# Patient Record
Sex: Female | Born: 1965 | Race: White | Hispanic: No | Marital: Married | Smoking: Never smoker
Health system: Southern US, Community
[De-identification: ages and names within clinical notes are randomized; demographics above are authoritative.]

## PROBLEM LIST (undated history)

## (undated) DIAGNOSIS — R002 Palpitations: Secondary | ICD-10-CM

## (undated) DIAGNOSIS — E039 Hypothyroidism, unspecified: Secondary | ICD-10-CM

## (undated) DIAGNOSIS — F909 Attention-deficit hyperactivity disorder, unspecified type: Secondary | ICD-10-CM

## (undated) HISTORY — DX: Palpitations: R00.2

---

## 1985-07-11 HISTORY — PX: APPENDECTOMY: SHX54

## 2004-09-08 ENCOUNTER — Encounter: Admission: RE | Admit: 2004-09-08 | Discharge: 2004-09-08 | Payer: Self-pay | Admitting: Family Medicine

## 2004-09-08 ENCOUNTER — Other Ambulatory Visit: Admission: RE | Admit: 2004-09-08 | Discharge: 2004-09-08 | Payer: Self-pay | Admitting: Family Medicine

## 2004-09-10 ENCOUNTER — Ambulatory Visit (HOSPITAL_COMMUNITY): Admission: RE | Admit: 2004-09-10 | Discharge: 2004-09-10 | Payer: Self-pay | Admitting: Family Medicine

## 2005-10-05 ENCOUNTER — Emergency Department (HOSPITAL_COMMUNITY): Admission: EM | Admit: 2005-10-05 | Discharge: 2005-10-05 | Payer: Self-pay | Admitting: Family Medicine

## 2005-12-08 ENCOUNTER — Other Ambulatory Visit: Admission: RE | Admit: 2005-12-08 | Discharge: 2005-12-08 | Payer: Self-pay | Admitting: Family Medicine

## 2006-07-16 ENCOUNTER — Emergency Department (HOSPITAL_COMMUNITY): Admission: EM | Admit: 2006-07-16 | Discharge: 2006-07-16 | Payer: Self-pay | Admitting: Family Medicine

## 2006-10-13 ENCOUNTER — Emergency Department (HOSPITAL_COMMUNITY): Admission: EM | Admit: 2006-10-13 | Discharge: 2006-10-13 | Payer: Self-pay | Admitting: Emergency Medicine

## 2007-11-05 ENCOUNTER — Other Ambulatory Visit: Admission: RE | Admit: 2007-11-05 | Discharge: 2007-11-05 | Payer: Self-pay | Admitting: Family Medicine

## 2009-12-02 ENCOUNTER — Emergency Department (HOSPITAL_COMMUNITY): Admission: EM | Admit: 2009-12-02 | Discharge: 2009-12-02 | Payer: Self-pay | Admitting: Family Medicine

## 2010-08-01 ENCOUNTER — Encounter: Payer: Self-pay | Admitting: Family Medicine

## 2010-08-02 ENCOUNTER — Encounter: Payer: Self-pay | Admitting: Family Medicine

## 2010-09-02 ENCOUNTER — Other Ambulatory Visit: Payer: Self-pay | Admitting: Family Medicine

## 2010-09-02 DIAGNOSIS — Z1231 Encounter for screening mammogram for malignant neoplasm of breast: Secondary | ICD-10-CM

## 2010-09-14 ENCOUNTER — Ambulatory Visit
Admission: RE | Admit: 2010-09-14 | Discharge: 2010-09-14 | Disposition: A | Discharge status: Home / Self Care | Payer: BC Managed Care – PPO | Source: Ambulatory Visit | Attending: Family Medicine | Admitting: Family Medicine

## 2010-09-14 DIAGNOSIS — Z1231 Encounter for screening mammogram for malignant neoplasm of breast: Secondary | ICD-10-CM

## 2013-08-25 ENCOUNTER — Encounter: Payer: Self-pay | Admitting: *Deleted

## 2013-08-25 DIAGNOSIS — R002 Palpitations: Secondary | ICD-10-CM | POA: Insufficient documentation

## 2017-05-17 ENCOUNTER — Other Ambulatory Visit: Payer: Self-pay | Admitting: Nurse Practitioner

## 2017-05-17 DIAGNOSIS — Z1239 Encounter for other screening for malignant neoplasm of breast: Secondary | ICD-10-CM

## 2017-05-24 ENCOUNTER — Ambulatory Visit (INDEPENDENT_AMBULATORY_CARE_PROVIDER_SITE_OTHER): Payer: BLUE CROSS/BLUE SHIELD

## 2017-05-24 DIAGNOSIS — Z1231 Encounter for screening mammogram for malignant neoplasm of breast: Secondary | ICD-10-CM

## 2017-05-24 DIAGNOSIS — Z1239 Encounter for other screening for malignant neoplasm of breast: Secondary | ICD-10-CM

## 2018-08-26 IMAGING — MG 2D DIGITAL SCREENING BILATERAL MAMMOGRAM WITH CAD AND ADJUNCT TO
9 series · 9 of 25 positions shown · non-contrast
Comparison: Previous exam(s).

CLINICAL DATA: Screening.

EXAM:
2D DIGITAL SCREENING BILATERAL MAMMOGRAM WITH CAD AND ADJUNCT TOMO

[R MLO (1 of 2)]
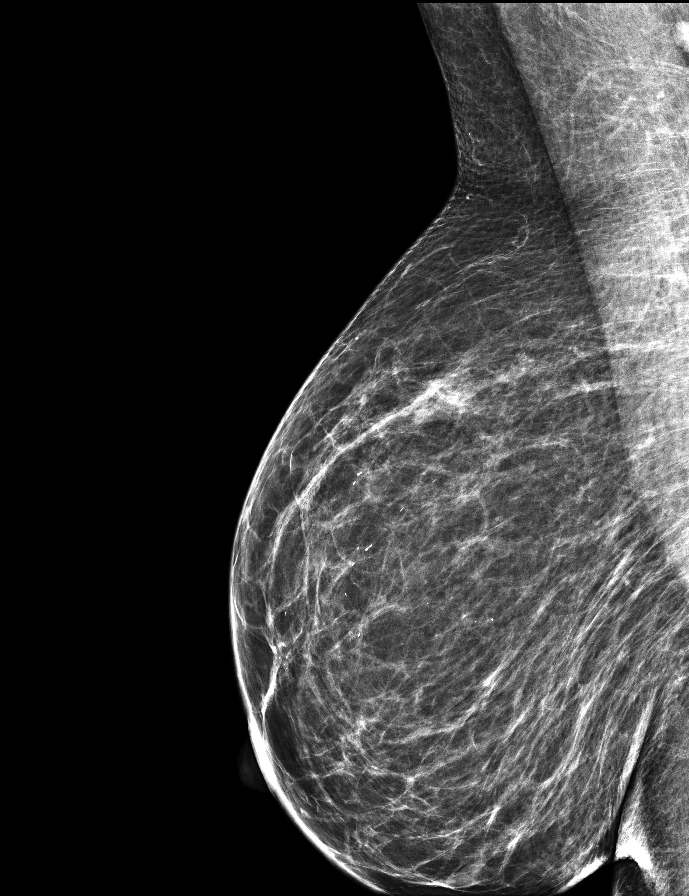

[R MLO (2 of 2)]
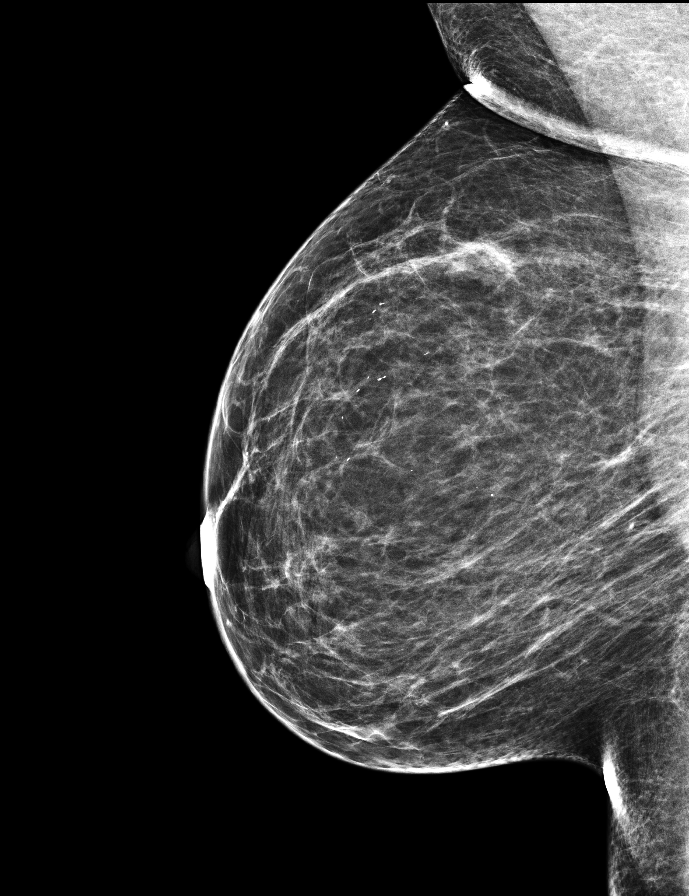

[L CC]
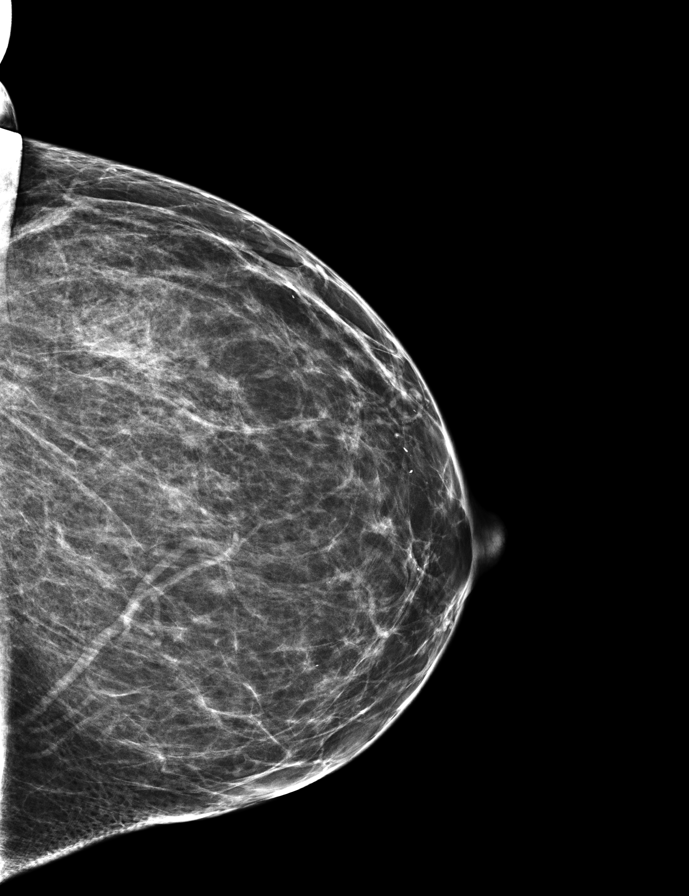

[R CC]
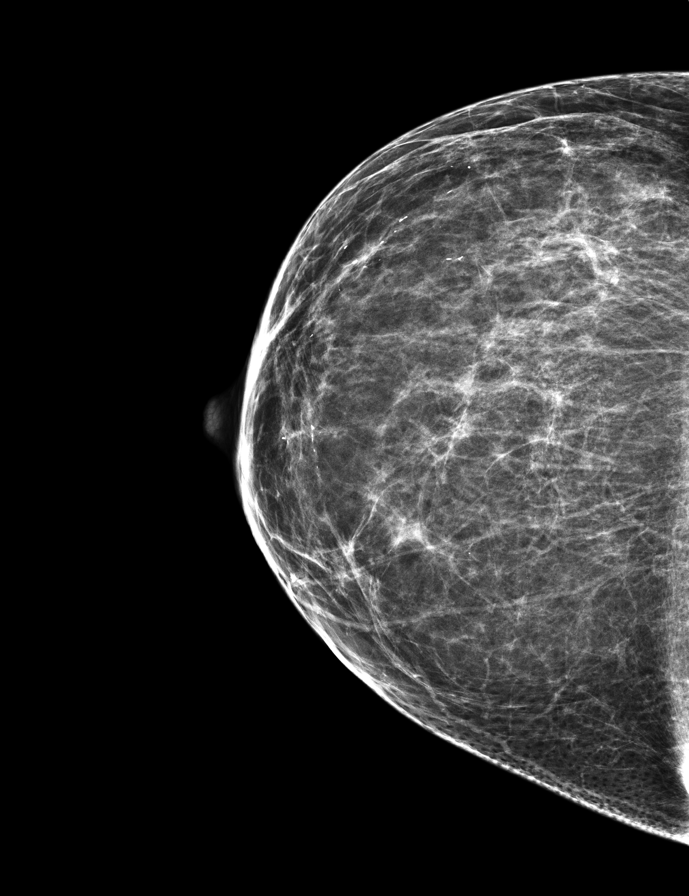

[L MLO]
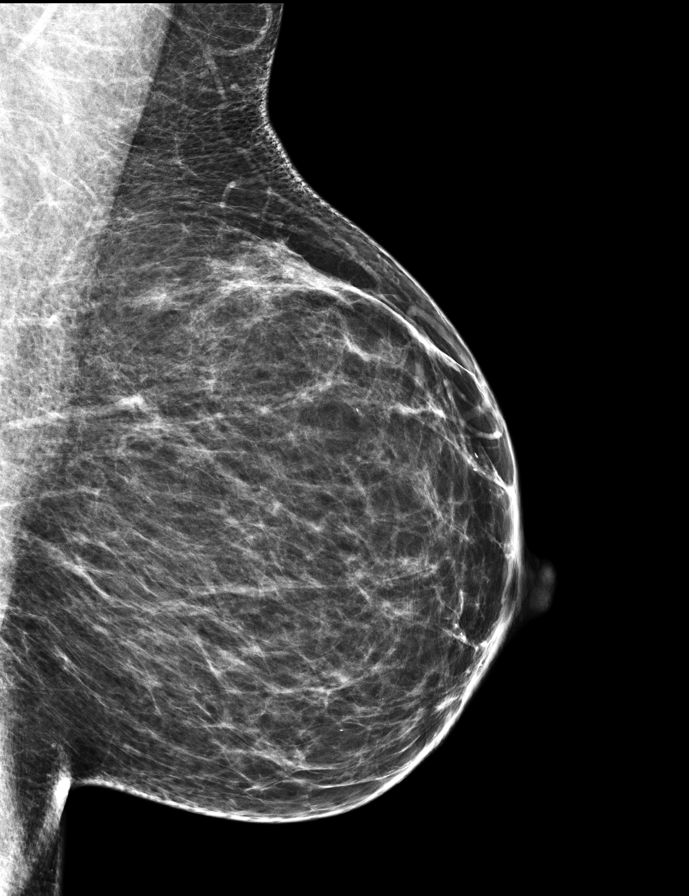

[R CC tomo · tomo slice 33/64.0]
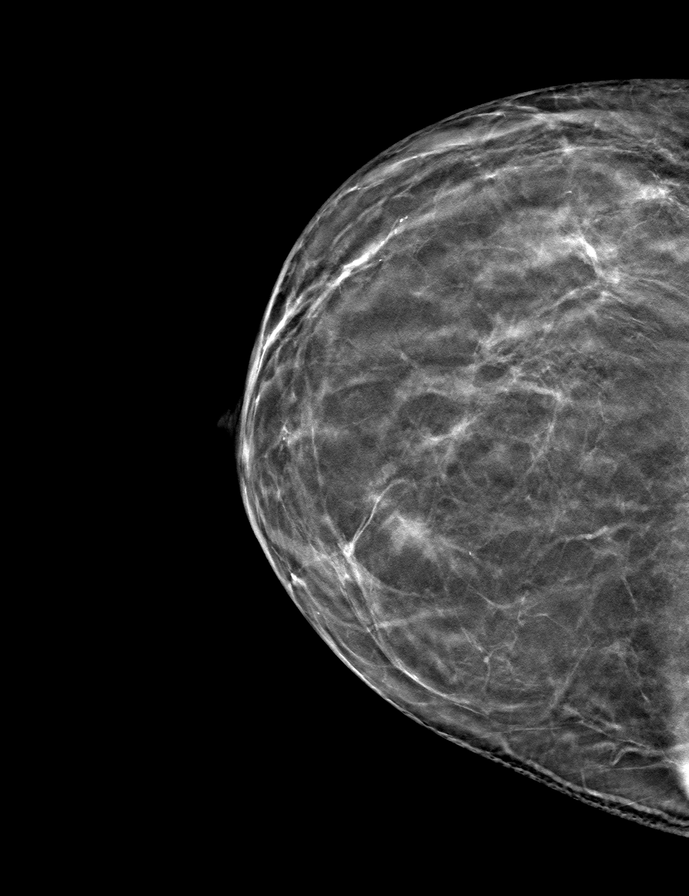

[L CC tomo · tomo slice 34/67.0]
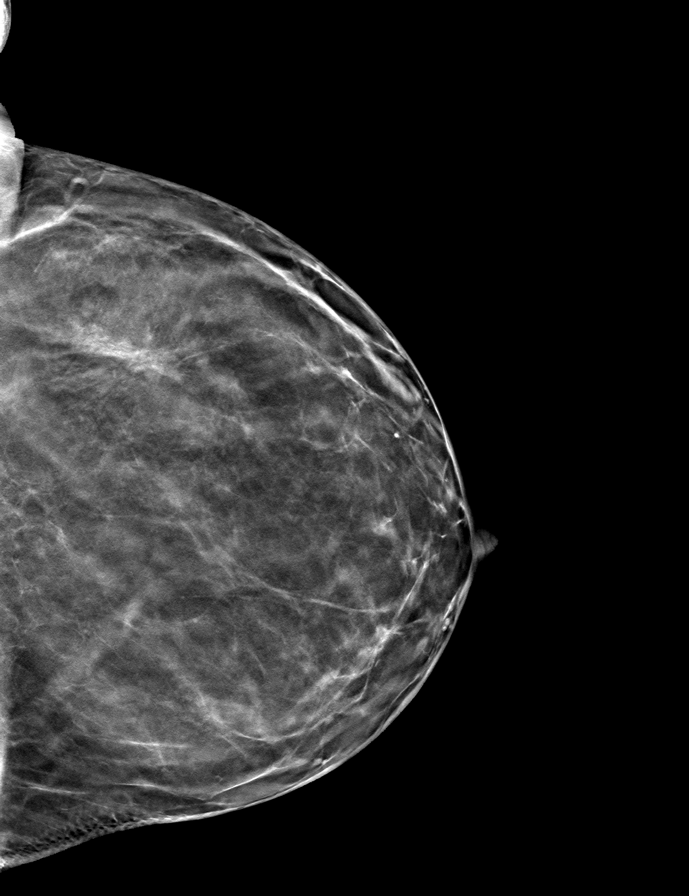

[R MLO tomo · tomo slice 35/70.0]
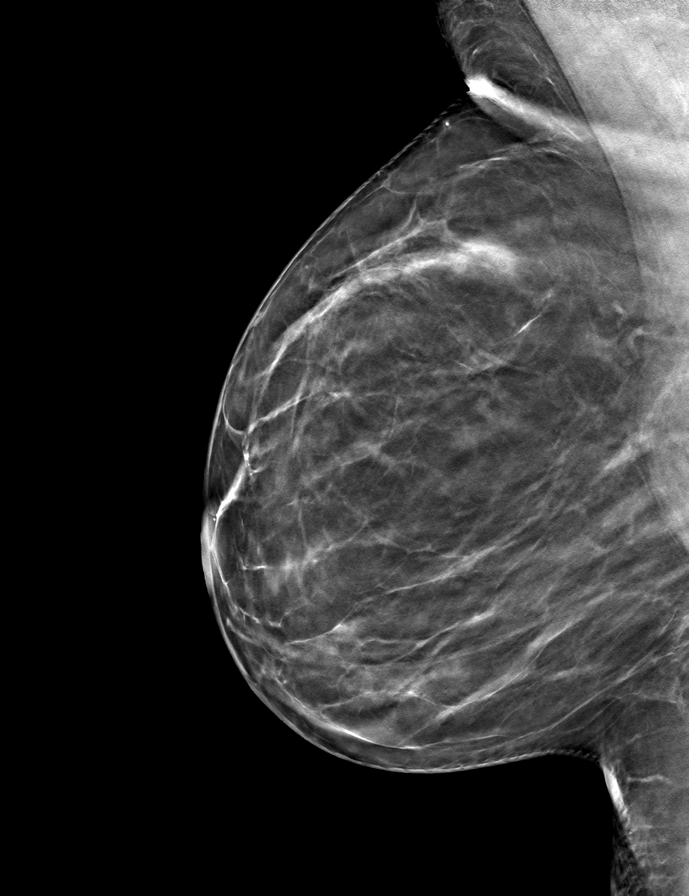

[L MLO tomo · tomo slice 35/68.0]
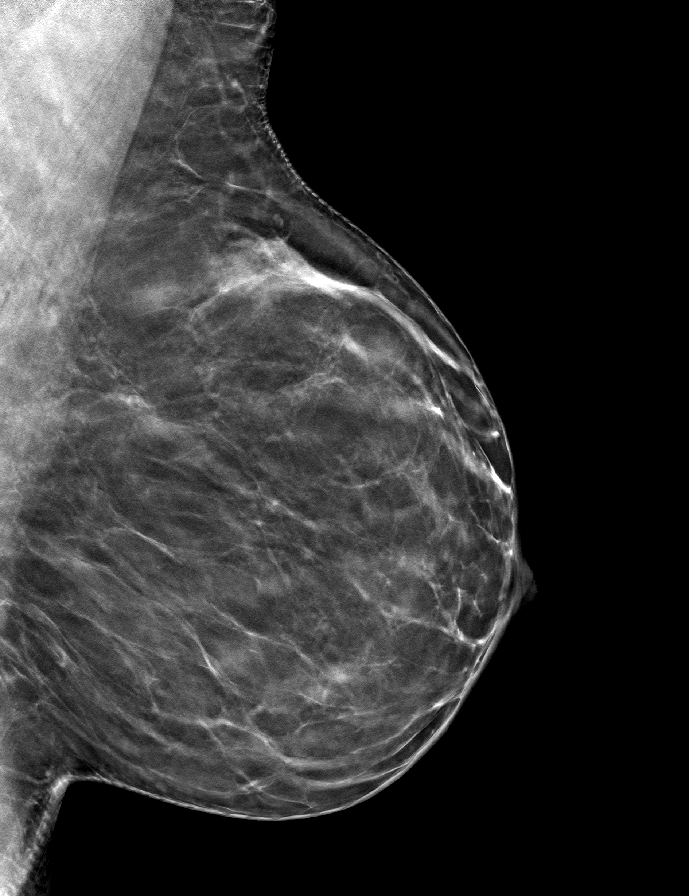

[9 of 25 positions shown; findings below may reference images not displayed]

ACR Breast Density Category b: There are scattered areas of
fibroglandular density.
FINDINGS: There are no findings suspicious for malignancy. Images were
processed with CAD.
IMPRESSION: No mammographic evidence of malignancy. A result letter of this
screening mammogram will be mailed directly to the patient.

RECOMMENDATION:
Screening mammogram in one year. (Code:97-6-RS4)

BI-RADS CATEGORY  1: Negative.

## 2020-04-14 DIAGNOSIS — E538 Deficiency of other specified B group vitamins: Secondary | ICD-10-CM | POA: Diagnosis not present

## 2020-04-14 DIAGNOSIS — N926 Irregular menstruation, unspecified: Secondary | ICD-10-CM | POA: Diagnosis not present

## 2020-04-14 DIAGNOSIS — E559 Vitamin D deficiency, unspecified: Secondary | ICD-10-CM | POA: Diagnosis not present

## 2020-04-14 DIAGNOSIS — R635 Abnormal weight gain: Secondary | ICD-10-CM | POA: Diagnosis not present

## 2020-04-14 DIAGNOSIS — L309 Dermatitis, unspecified: Secondary | ICD-10-CM | POA: Diagnosis not present

## 2020-04-28 DIAGNOSIS — E039 Hypothyroidism, unspecified: Secondary | ICD-10-CM | POA: Diagnosis not present

## 2020-04-28 DIAGNOSIS — L309 Dermatitis, unspecified: Secondary | ICD-10-CM | POA: Diagnosis not present

## 2020-04-28 DIAGNOSIS — E559 Vitamin D deficiency, unspecified: Secondary | ICD-10-CM | POA: Diagnosis not present

## 2020-04-28 DIAGNOSIS — N951 Menopausal and female climacteric states: Secondary | ICD-10-CM | POA: Diagnosis not present

## 2020-07-21 DIAGNOSIS — L309 Dermatitis, unspecified: Secondary | ICD-10-CM | POA: Diagnosis not present

## 2020-07-21 DIAGNOSIS — R635 Abnormal weight gain: Secondary | ICD-10-CM | POA: Diagnosis not present

## 2020-07-21 DIAGNOSIS — E559 Vitamin D deficiency, unspecified: Secondary | ICD-10-CM | POA: Diagnosis not present

## 2020-07-21 DIAGNOSIS — E039 Hypothyroidism, unspecified: Secondary | ICD-10-CM | POA: Diagnosis not present

## 2020-07-21 DIAGNOSIS — E538 Deficiency of other specified B group vitamins: Secondary | ICD-10-CM | POA: Diagnosis not present

## 2020-08-04 DIAGNOSIS — E039 Hypothyroidism, unspecified: Secondary | ICD-10-CM | POA: Diagnosis not present

## 2020-08-04 DIAGNOSIS — E538 Deficiency of other specified B group vitamins: Secondary | ICD-10-CM | POA: Diagnosis not present

## 2020-08-04 DIAGNOSIS — L309 Dermatitis, unspecified: Secondary | ICD-10-CM | POA: Diagnosis not present

## 2020-08-04 DIAGNOSIS — N951 Menopausal and female climacteric states: Secondary | ICD-10-CM | POA: Diagnosis not present

## 2020-10-09 DIAGNOSIS — Z2882 Immunization not carried out because of caregiver refusal: Secondary | ICD-10-CM | POA: Diagnosis not present

## 2021-02-11 DIAGNOSIS — E538 Deficiency of other specified B group vitamins: Secondary | ICD-10-CM | POA: Diagnosis not present

## 2021-02-11 DIAGNOSIS — Z131 Encounter for screening for diabetes mellitus: Secondary | ICD-10-CM | POA: Diagnosis not present

## 2021-02-11 DIAGNOSIS — E039 Hypothyroidism, unspecified: Secondary | ICD-10-CM | POA: Diagnosis not present

## 2021-02-11 DIAGNOSIS — E559 Vitamin D deficiency, unspecified: Secondary | ICD-10-CM | POA: Diagnosis not present

## 2021-02-11 DIAGNOSIS — N951 Menopausal and female climacteric states: Secondary | ICD-10-CM | POA: Diagnosis not present

## 2021-07-27 DIAGNOSIS — S99922A Unspecified injury of left foot, initial encounter: Secondary | ICD-10-CM | POA: Diagnosis not present

## 2021-07-27 DIAGNOSIS — S92512A Displaced fracture of proximal phalanx of left lesser toe(s), initial encounter for closed fracture: Secondary | ICD-10-CM | POA: Diagnosis not present

## 2021-08-03 DIAGNOSIS — Z131 Encounter for screening for diabetes mellitus: Secondary | ICD-10-CM | POA: Diagnosis not present

## 2021-08-03 DIAGNOSIS — E559 Vitamin D deficiency, unspecified: Secondary | ICD-10-CM | POA: Diagnosis not present

## 2021-08-03 DIAGNOSIS — E60 Dietary zinc deficiency: Secondary | ICD-10-CM | POA: Diagnosis not present

## 2021-08-03 DIAGNOSIS — E039 Hypothyroidism, unspecified: Secondary | ICD-10-CM | POA: Diagnosis not present

## 2021-08-03 DIAGNOSIS — N951 Menopausal and female climacteric states: Secondary | ICD-10-CM | POA: Diagnosis not present

## 2021-08-11 DIAGNOSIS — M79675 Pain in left toe(s): Secondary | ICD-10-CM | POA: Diagnosis not present

## 2021-08-18 DIAGNOSIS — E538 Deficiency of other specified B group vitamins: Secondary | ICD-10-CM | POA: Diagnosis not present

## 2021-08-18 DIAGNOSIS — E039 Hypothyroidism, unspecified: Secondary | ICD-10-CM | POA: Diagnosis not present

## 2021-08-18 DIAGNOSIS — E559 Vitamin D deficiency, unspecified: Secondary | ICD-10-CM | POA: Diagnosis not present

## 2021-08-18 DIAGNOSIS — N951 Menopausal and female climacteric states: Secondary | ICD-10-CM | POA: Diagnosis not present

## 2021-08-25 DIAGNOSIS — M79675 Pain in left toe(s): Secondary | ICD-10-CM | POA: Diagnosis not present

## 2021-10-08 DIAGNOSIS — M79675 Pain in left toe(s): Secondary | ICD-10-CM | POA: Diagnosis not present

## 2022-03-09 DIAGNOSIS — Z01419 Encounter for gynecological examination (general) (routine) without abnormal findings: Secondary | ICD-10-CM | POA: Diagnosis not present

## 2022-03-09 DIAGNOSIS — Z124 Encounter for screening for malignant neoplasm of cervix: Secondary | ICD-10-CM | POA: Diagnosis not present

## 2022-03-09 DIAGNOSIS — Z23 Encounter for immunization: Secondary | ICD-10-CM | POA: Diagnosis not present

## 2022-03-11 ENCOUNTER — Other Ambulatory Visit: Payer: Self-pay | Admitting: Family Medicine

## 2022-03-11 DIAGNOSIS — K429 Umbilical hernia without obstruction or gangrene: Secondary | ICD-10-CM

## 2022-03-15 ENCOUNTER — Other Ambulatory Visit: Payer: Self-pay | Admitting: Family Medicine

## 2022-03-15 DIAGNOSIS — Z1231 Encounter for screening mammogram for malignant neoplasm of breast: Secondary | ICD-10-CM

## 2022-03-21 ENCOUNTER — Ambulatory Visit (INDEPENDENT_AMBULATORY_CARE_PROVIDER_SITE_OTHER): Payer: BC Managed Care – PPO

## 2022-03-21 DIAGNOSIS — K429 Umbilical hernia without obstruction or gangrene: Secondary | ICD-10-CM

## 2022-03-21 DIAGNOSIS — K760 Fatty (change of) liver, not elsewhere classified: Secondary | ICD-10-CM | POA: Diagnosis not present

## 2022-03-24 ENCOUNTER — Ambulatory Visit (INDEPENDENT_AMBULATORY_CARE_PROVIDER_SITE_OTHER): Payer: BC Managed Care – PPO

## 2022-03-24 DIAGNOSIS — Z1231 Encounter for screening mammogram for malignant neoplasm of breast: Secondary | ICD-10-CM

## 2022-03-25 DIAGNOSIS — Z13220 Encounter for screening for lipoid disorders: Secondary | ICD-10-CM | POA: Diagnosis not present

## 2022-03-25 DIAGNOSIS — R221 Localized swelling, mass and lump, neck: Secondary | ICD-10-CM | POA: Diagnosis not present

## 2022-03-25 DIAGNOSIS — E6609 Other obesity due to excess calories: Secondary | ICD-10-CM | POA: Diagnosis not present

## 2022-03-30 DIAGNOSIS — N951 Menopausal and female climacteric states: Secondary | ICD-10-CM | POA: Diagnosis not present

## 2022-03-30 DIAGNOSIS — E559 Vitamin D deficiency, unspecified: Secondary | ICD-10-CM | POA: Diagnosis not present

## 2022-03-30 DIAGNOSIS — E538 Deficiency of other specified B group vitamins: Secondary | ICD-10-CM | POA: Diagnosis not present

## 2022-03-30 DIAGNOSIS — R739 Hyperglycemia, unspecified: Secondary | ICD-10-CM | POA: Diagnosis not present

## 2022-03-30 DIAGNOSIS — E039 Hypothyroidism, unspecified: Secondary | ICD-10-CM | POA: Diagnosis not present

## 2022-03-31 ENCOUNTER — Telehealth (HOSPITAL_BASED_OUTPATIENT_CLINIC_OR_DEPARTMENT_OTHER): Payer: Self-pay

## 2022-03-31 ENCOUNTER — Other Ambulatory Visit (HOSPITAL_BASED_OUTPATIENT_CLINIC_OR_DEPARTMENT_OTHER): Payer: Self-pay | Admitting: Family Medicine

## 2022-03-31 DIAGNOSIS — R221 Localized swelling, mass and lump, neck: Secondary | ICD-10-CM

## 2022-04-12 DIAGNOSIS — N841 Polyp of cervix uteri: Secondary | ICD-10-CM | POA: Diagnosis not present

## 2022-04-12 DIAGNOSIS — R87619 Unspecified abnormal cytological findings in specimens from cervix uteri: Secondary | ICD-10-CM | POA: Diagnosis not present

## 2022-04-13 DIAGNOSIS — E039 Hypothyroidism, unspecified: Secondary | ICD-10-CM | POA: Diagnosis not present

## 2022-04-13 DIAGNOSIS — N951 Menopausal and female climacteric states: Secondary | ICD-10-CM | POA: Diagnosis not present

## 2022-04-19 ENCOUNTER — Ambulatory Visit (HOSPITAL_BASED_OUTPATIENT_CLINIC_OR_DEPARTMENT_OTHER)
Admission: RE | Admit: 2022-04-19 | Discharge: 2022-04-19 | Disposition: A | Discharge status: Home / Self Care | Payer: BC Managed Care – PPO | Source: Ambulatory Visit | Attending: Family Medicine | Admitting: Family Medicine

## 2022-04-19 DIAGNOSIS — R221 Localized swelling, mass and lump, neck: Secondary | ICD-10-CM | POA: Insufficient documentation

## 2022-04-19 DIAGNOSIS — E042 Nontoxic multinodular goiter: Secondary | ICD-10-CM | POA: Diagnosis not present

## 2022-05-11 DIAGNOSIS — R41840 Attention and concentration deficit: Secondary | ICD-10-CM | POA: Diagnosis not present

## 2022-05-12 DIAGNOSIS — R41840 Attention and concentration deficit: Secondary | ICD-10-CM | POA: Diagnosis not present

## 2022-05-17 DIAGNOSIS — F419 Anxiety disorder, unspecified: Secondary | ICD-10-CM | POA: Diagnosis not present

## 2022-05-17 DIAGNOSIS — R41840 Attention and concentration deficit: Secondary | ICD-10-CM | POA: Diagnosis not present

## 2022-05-24 DIAGNOSIS — N841 Polyp of cervix uteri: Secondary | ICD-10-CM | POA: Diagnosis not present

## 2022-05-25 DIAGNOSIS — Z23 Encounter for immunization: Secondary | ICD-10-CM | POA: Diagnosis not present

## 2022-06-17 DIAGNOSIS — R41840 Attention and concentration deficit: Secondary | ICD-10-CM | POA: Diagnosis not present

## 2022-06-17 DIAGNOSIS — L720 Epidermal cyst: Secondary | ICD-10-CM | POA: Diagnosis not present

## 2022-06-17 DIAGNOSIS — L821 Other seborrheic keratosis: Secondary | ICD-10-CM | POA: Diagnosis not present

## 2022-06-17 DIAGNOSIS — D2261 Melanocytic nevi of right upper limb, including shoulder: Secondary | ICD-10-CM | POA: Diagnosis not present

## 2022-06-17 DIAGNOSIS — L570 Actinic keratosis: Secondary | ICD-10-CM | POA: Diagnosis not present

## 2022-06-17 DIAGNOSIS — L820 Inflamed seborrheic keratosis: Secondary | ICD-10-CM | POA: Diagnosis not present

## 2022-06-17 DIAGNOSIS — D2262 Melanocytic nevi of left upper limb, including shoulder: Secondary | ICD-10-CM | POA: Diagnosis not present

## 2022-06-20 DIAGNOSIS — D122 Benign neoplasm of ascending colon: Secondary | ICD-10-CM | POA: Diagnosis not present

## 2022-06-20 DIAGNOSIS — D120 Benign neoplasm of cecum: Secondary | ICD-10-CM | POA: Diagnosis not present

## 2022-06-20 DIAGNOSIS — K635 Polyp of colon: Secondary | ICD-10-CM | POA: Diagnosis not present

## 2022-06-20 DIAGNOSIS — R41840 Attention and concentration deficit: Secondary | ICD-10-CM | POA: Diagnosis not present

## 2022-06-20 DIAGNOSIS — Z1211 Encounter for screening for malignant neoplasm of colon: Secondary | ICD-10-CM | POA: Diagnosis not present

## 2022-06-22 DIAGNOSIS — Z79899 Other long term (current) drug therapy: Secondary | ICD-10-CM | POA: Diagnosis not present

## 2022-06-22 DIAGNOSIS — F419 Anxiety disorder, unspecified: Secondary | ICD-10-CM | POA: Diagnosis not present

## 2022-06-22 DIAGNOSIS — F902 Attention-deficit hyperactivity disorder, combined type: Secondary | ICD-10-CM | POA: Diagnosis not present

## 2022-07-13 ENCOUNTER — Other Ambulatory Visit (HOSPITAL_BASED_OUTPATIENT_CLINIC_OR_DEPARTMENT_OTHER): Payer: Self-pay

## 2022-07-13 MED ORDER — LISDEXAMFETAMINE DIMESYLATE 40 MG PO CAPS
40.0000 mg | ORAL_CAPSULE | Freq: Every day | ORAL | 0 refills | Status: DC
  Filled 2022-07-13: qty 30, 30d supply, fill #0

## 2022-08-05 ENCOUNTER — Other Ambulatory Visit (HOSPITAL_COMMUNITY): Payer: Self-pay

## 2022-08-11 ENCOUNTER — Other Ambulatory Visit (HOSPITAL_BASED_OUTPATIENT_CLINIC_OR_DEPARTMENT_OTHER): Payer: Self-pay

## 2022-08-11 MED ORDER — LISDEXAMFETAMINE DIMESYLATE 40 MG PO CAPS
40.0000 mg | ORAL_CAPSULE | Freq: Every day | ORAL | 0 refills | Status: DC
  Filled 2022-08-11: qty 30, 30d supply, fill #0

## 2022-08-14 NOTE — H&P (Incomplete)
Kylie Lynch is an 57 y.o. postmenopausal G5P5 who is admitted for Hysteroscopy with Dilation and Curettage with possible Myosure polypectomy and removal of cervical polyps.  Two mildly friable cervical polyps were noted on previous exam 04/09/2022. Pt prefers for outpatient removal of cervical polyps.  Patient had a colposcopy and EMB (benign) for AUGS/HRHPV negative pap smear.   Patient Active Problem List   Diagnosis Date Noted   Cervical polyp 08/27/2022   Pain of toe of left foot 08/11/2021   Heart palpitations 08/25/2013    MEDICAL/FAMILY/SOCIAL HX: No LMP recorded. Patient is postmenopausal.    Past Medical History:  Diagnosis Date   Heart palpitations     No past surgical history on file.  No family history on file.  Social History:  reports that she has never smoked. She does not have any smokeless tobacco history on file. She reports current alcohol use. She reports that she does not use drugs.  ALLERGIES/MEDS:  Allergies: No Known Allergies  No medications prior to admission.     Review of Systems  Constitutional: Negative.   HENT: Negative.    Eyes: Negative.   Respiratory: Negative.    Cardiovascular: Negative.   Gastrointestinal: Negative.   Genitourinary: Negative.   Musculoskeletal: Negative.   Skin: Negative.   Neurological: Negative.   Endo/Heme/Allergies: Negative.   Psychiatric/Behavioral: Negative.      There were no vitals taken for this visit. Gen:  NAD, pleasant and cooperative Cardio:  RRR Pulm:  CTAB, no wheezes/rales/rhonchi Abd:  Soft, non-distended, non-tender throughout, no rebound/guarding Ext:  No bilateral LE edema, no bilateral calf tenderness Pelvic: labia - unremarkable, vagina - pink moist mucosa, no lesions or abnormal discharge, cervix - no discharge or lesions or CMT, two polyps protruding from external cervical os - one ~2cm in size and the other ~1cm in size, mildly friable today, adnexa - no masses or  tenderness, uterus - nontender and normal size on palpation.   No results found for this or any previous visit (from the past 24 hour(s)).  No results found.   ASSESSMENT/PLAN: DARBEY BORBON is a 57 y.o. postmenopausal G5P5 who is admitted for Hysteroscopy with Dilation and Curettage with possible Myosure polypectomy and removal of cervical polyps.  - Admit to [facility] - Admit labs (CBC, T&S, COVID screen) - Diet:  Per anesthesia/ERAS pathway - IVF:  Per anesthesia - VTE Prophylaxis:  SCDs - D/C home same-day  Consents: I discussed with the patient that this surgery is performed to look inside the uterus and remove the uterine lining.  Prior to surgery, the risks and benefits of the surgery, as well as alternative treatments, have been discussed.  The risks include, but are not limited to bleeding, including the need for a blood transfusion, infection, damage to organs and tissues, including uterine perforation, requiring additional surgery, postoperative pain, short-term and long-term, failure of the procedure to control symptoms, need for hysterectomy to control bleeding, fluid overload, which could create electrolyte abnormalities and the need to stop the procedure before completion, inability to safely complete the procedure, deep vein thrombosis and/or pulmonary embolism, painful intercourse, complications the course of which cannot be predicted or prevented, and death.  Patient was consented for blood products.  The patient is aware that bleeding may result in the need for a blood transfusion which includes risk of transmission of HIV (1:2 million), Hepatitis C (1:2 million), and Hepatitis B (1:200 thousand) and transfusion reaction.  Patient voiced understanding of the above risks as  well as understanding of indications for blood transfusion.  [name], DO

## 2022-08-27 DIAGNOSIS — N841 Polyp of cervix uteri: Secondary | ICD-10-CM | POA: Insufficient documentation

## 2022-08-30 ENCOUNTER — Encounter (HOSPITAL_BASED_OUTPATIENT_CLINIC_OR_DEPARTMENT_OTHER): Payer: Self-pay | Admitting: Obstetrics and Gynecology

## 2022-08-30 NOTE — Progress Notes (Signed)
Spoke w/ via phone for pre-op interview--- [place]----   T&S and CBC per surgeon            Lab results------ COVID test -----patient states asymptomatic no test needed Arrive at -------[id number] NPO after MN NO Solid Food.   Med rec completed Medications to take morning of surgery -----[place] Thyroid Diabetic medication ----- Patient instructed no nail polish to be worn day of surgery Patient instructed to bring photo id and insurance card day of surgery Patient aware to have Driver (ride ) / caregiver   Husband Kylie Lynch for 24 hours after surgery  Patient Special Instructions ----- Pre-Op special Istructions ----- Patient verbalized understanding of instructions that were given at this phone interview. Patient denies shortness of breath, chest pain, fever, cough at this phone interview.

## 2022-09-01 DIAGNOSIS — N841 Polyp of cervix uteri: Secondary | ICD-10-CM | POA: Diagnosis not present

## 2022-09-01 DIAGNOSIS — Z01818 Encounter for other preprocedural examination: Secondary | ICD-10-CM | POA: Diagnosis not present

## 2022-09-07 ENCOUNTER — Other Ambulatory Visit: Payer: Self-pay

## 2022-09-07 ENCOUNTER — Ambulatory Visit (HOSPITAL_BASED_OUTPATIENT_CLINIC_OR_DEPARTMENT_OTHER): Payer: BC Managed Care – PPO | Admitting: Anesthesiology

## 2022-09-07 ENCOUNTER — Encounter (HOSPITAL_BASED_OUTPATIENT_CLINIC_OR_DEPARTMENT_OTHER)
Admission: RE | Disposition: A | Discharge status: Home / Self Care | Payer: Self-pay | Source: Ambulatory Visit | Attending: Obstetrics and Gynecology

## 2022-09-07 ENCOUNTER — Ambulatory Visit (HOSPITAL_BASED_OUTPATIENT_CLINIC_OR_DEPARTMENT_OTHER)
Admission: RE | Admit: 2022-09-07 | Discharge: 2022-09-07 | Disposition: A | Discharge status: Home / Self Care | Payer: BC Managed Care – PPO | Source: Ambulatory Visit | Attending: Obstetrics and Gynecology | Admitting: Obstetrics and Gynecology

## 2022-09-07 ENCOUNTER — Encounter (HOSPITAL_BASED_OUTPATIENT_CLINIC_OR_DEPARTMENT_OTHER): Payer: Self-pay | Admitting: Obstetrics and Gynecology

## 2022-09-07 DIAGNOSIS — N72 Inflammatory disease of cervix uteri: Secondary | ICD-10-CM | POA: Diagnosis not present

## 2022-09-07 DIAGNOSIS — N841 Polyp of cervix uteri: Secondary | ICD-10-CM | POA: Diagnosis not present

## 2022-09-07 HISTORY — DX: Attention-deficit hyperactivity disorder, unspecified type: F90.9

## 2022-09-07 HISTORY — PX: DILATATION & CURETTAGE/HYSTEROSCOPY WITH MYOSURE: SHX6511

## 2022-09-07 HISTORY — DX: Hypothyroidism, unspecified: E03.9

## 2022-09-07 LAB — TYPE AND SCREEN
ABO/RH(D): A POS
Antibody Screen: NEGATIVE

## 2022-09-07 LAB — CBC
HCT: 45.5 % (ref 36.0–46.0)
Hemoglobin: 14.9 g/dL (ref 12.0–15.0)
MCH: 30.5 pg (ref 26.0–34.0)
MCHC: 32.7 g/dL (ref 30.0–36.0)
MCV: 93.2 fL (ref 80.0–100.0)
Platelets: 252 10*3/uL (ref 150–400)
RBC: 4.88 MIL/uL (ref 3.87–5.11)
RDW: 13.8 % (ref 11.5–15.5)
WBC: 6 10*3/uL (ref 4.0–10.5)
nRBC: 0 % (ref 0.0–0.2)

## 2022-09-07 LAB — ABO/RH: ABO/RH(D): A POS

## 2022-09-07 SURGERY — DILATATION & CURETTAGE/HYSTEROSCOPY WITH MYOSURE
Anesthesia: General | Site: Vagina

## 2022-09-07 MED ORDER — FENTANYL CITRATE (PF) 100 MCG/2ML IJ SOLN
INTRAMUSCULAR | Status: AC
  Filled 2022-09-07: qty 2

## 2022-09-07 MED ORDER — OXYCODONE HCL 5 MG/5ML PO SOLN: 5.0000 mg | Freq: Once | ORAL | Status: DC | PRN

## 2022-09-07 MED ORDER — FLUORESCEIN SODIUM 10 % IV SOLN
INTRAVENOUS | Status: AC
  Filled 2022-09-07: qty 5

## 2022-09-07 MED ORDER — MIDAZOLAM HCL 5 MG/5ML IJ SOLN
INTRAMUSCULAR | Status: DC | PRN
  Administered 2022-09-07: 2 mg via INTRAVENOUS

## 2022-09-07 MED ORDER — KETOROLAC TROMETHAMINE 30 MG/ML IJ SOLN: 30.0000 mg | Freq: Once | INTRAMUSCULAR | Status: DC | PRN

## 2022-09-07 MED ORDER — SILVER NITRATE-POT NITRATE 75-25 % EX MISC
CUTANEOUS | Status: DC | PRN
  Administered 2022-09-07: 4

## 2022-09-07 MED ORDER — ACETAMINOPHEN 160 MG/5ML PO SOLN: 325.0000 mg | ORAL | Status: DC | PRN

## 2022-09-07 MED ORDER — FENTANYL CITRATE (PF) 100 MCG/2ML IJ SOLN
INTRAMUSCULAR | Status: DC | PRN
  Administered 2022-09-07: 100 ug via INTRAVENOUS

## 2022-09-07 MED ORDER — LACTATED RINGERS IV SOLN: INTRAVENOUS | Status: DC

## 2022-09-07 MED ORDER — IBUPROFEN 800 MG PO TABS: 800.0000 mg | ORAL_TABLET | Freq: Three times a day (TID) | ORAL | 0 refills | Status: AC | PRN

## 2022-09-07 MED ORDER — ACETAMINOPHEN 325 MG PO TABS: 325.0000 mg | ORAL_TABLET | ORAL | Status: DC | PRN

## 2022-09-07 MED ORDER — LIDOCAINE 2% (20 MG/ML) 5 ML SYRINGE
INTRAMUSCULAR | Status: DC | PRN
  Administered 2022-09-07: 100 mg via INTRAVENOUS

## 2022-09-07 MED ORDER — MIDAZOLAM HCL 2 MG/2ML IJ SOLN
INTRAMUSCULAR | Status: AC
  Filled 2022-09-07: qty 2

## 2022-09-07 MED ORDER — KETOROLAC TROMETHAMINE 30 MG/ML IJ SOLN
INTRAMUSCULAR | Status: DC | PRN
  Administered 2022-09-07: 30 mg via INTRAVENOUS

## 2022-09-07 MED ORDER — ONDANSETRON HCL 4 MG/2ML IJ SOLN: 4.0000 mg | Freq: Once | INTRAMUSCULAR | Status: DC | PRN

## 2022-09-07 MED ORDER — DEXAMETHASONE SODIUM PHOSPHATE 10 MG/ML IJ SOLN
INTRAMUSCULAR | Status: AC
  Filled 2022-09-07: qty 1

## 2022-09-07 MED ORDER — FENTANYL CITRATE (PF) 100 MCG/2ML IJ SOLN: 25.0000 ug | INTRAMUSCULAR | Status: DC | PRN

## 2022-09-07 MED ORDER — SODIUM CHLORIDE 0.9 % IR SOLN
Status: DC | PRN
  Administered 2022-09-07: 3000 mL

## 2022-09-07 MED ORDER — PROPOFOL 10 MG/ML IV BOLUS
INTRAVENOUS | Status: AC
  Filled 2022-09-07: qty 20

## 2022-09-07 MED ORDER — EPHEDRINE SULFATE-NACL 50-0.9 MG/10ML-% IV SOSY
PREFILLED_SYRINGE | INTRAVENOUS | Status: DC | PRN
  Administered 2022-09-07: 10 mg via INTRAVENOUS
  Administered 2022-09-07: 5 mg via INTRAVENOUS
  Administered 2022-09-07: 10 mg via INTRAVENOUS

## 2022-09-07 MED ORDER — EPHEDRINE 5 MG/ML INJ
INTRAVENOUS | Status: AC
  Filled 2022-09-07: qty 5

## 2022-09-07 MED ORDER — PROPOFOL 10 MG/ML IV BOLUS
INTRAVENOUS | Status: DC | PRN
  Administered 2022-09-07: 200 mg via INTRAVENOUS

## 2022-09-07 MED ORDER — KETOROLAC TROMETHAMINE 30 MG/ML IJ SOLN
INTRAMUSCULAR | Status: AC
  Filled 2022-09-07: qty 1

## 2022-09-07 MED ORDER — OXYCODONE HCL 5 MG PO TABS: 5.0000 mg | ORAL_TABLET | Freq: Once | ORAL | Status: DC | PRN

## 2022-09-07 MED ORDER — ONDANSETRON HCL 4 MG/2ML IJ SOLN
INTRAMUSCULAR | Status: AC
  Filled 2022-09-07: qty 2

## 2022-09-07 MED ORDER — MEPERIDINE HCL 25 MG/ML IJ SOLN: 6.2500 mg | INTRAMUSCULAR | Status: DC | PRN

## 2022-09-07 MED ORDER — DEXAMETHASONE SODIUM PHOSPHATE 10 MG/ML IJ SOLN
INTRAMUSCULAR | Status: DC | PRN
  Administered 2022-09-07: 5 mg via INTRAVENOUS

## 2022-09-07 MED ORDER — ONDANSETRON HCL 4 MG/2ML IJ SOLN
INTRAMUSCULAR | Status: DC | PRN
  Administered 2022-09-07: 4 mg via INTRAVENOUS

## 2022-09-07 SURGICAL SUPPLY — 18 items
CATH ROBINSON RED A/P 16FR (CATHETERS) ×2 IMPLANT
DEVICE MYOSURE LITE (MISCELLANEOUS) IMPLANT
DEVICE MYOSURE REACH (MISCELLANEOUS) IMPLANT
DILATOR CANAL MILEX (MISCELLANEOUS) IMPLANT
DRSG TELFA 3X8 NADH STRL (GAUZE/BANDAGES/DRESSINGS) ×2 IMPLANT
GAUZE 4X4 16PLY ~~LOC~~+RFID DBL (SPONGE) ×2 IMPLANT
GLOVE BIO SURGEON STRL SZ 6.5 (GLOVE) ×2 IMPLANT
GLOVE BIOGEL M 6.5 STRL (GLOVE) ×2 IMPLANT
GLOVE BIOGEL PI IND STRL 7.0 (GLOVE) ×2 IMPLANT
GOWN STRL REUS W/TWL LRG LVL3 (GOWN DISPOSABLE) ×2 IMPLANT
KIT PROCEDURE FLUENT (KITS) ×2 IMPLANT
KIT TURNOVER CYSTO (KITS) ×2 IMPLANT
PACK VAGINAL MINOR WOMEN LF (CUSTOM PROCEDURE TRAY) ×2 IMPLANT
PAD OB MATERNITY 4.3X12.25 (PERSONAL CARE ITEMS) ×2 IMPLANT
SEAL ROD LENS SCOPE MYOSURE (ABLATOR) ×2 IMPLANT
SLEEVE SCD COMPRESS KNEE MED (STOCKING) ×2 IMPLANT
TOWEL OR 17X24 6PK STRL BLUE (TOWEL DISPOSABLE) ×4 IMPLANT
UNDERPAD 30X36 HEAVY ABSORB (UNDERPADS AND DIAPERS) ×2 IMPLANT

## 2022-09-07 NOTE — Op Note (Signed)
Pre Op Dx:   1. Cervical polyps  Post Op Dx:   Same as pre-operative diagnoses  Procedure:   1. Cervical polypectomy 2. Diagnostic hysteroscopy   Surgeon:  Dr. [name] Assistants:  None Anesthesia:  LMA   EBL:  10cc  IVF:  See anesthesia documentation UOP:  Voided prior to arrival to OR Fluid Deficit:  90cc   Drains:  None Specimen removed:  Cervical polyps - sent to pathology Device(s) implanted: None Case Type:  Clean-contaminated Findings:  Normal-appearing ectocervix with multiple large cervical polyps. On bimanual exam, uterus anteverted and normal in size. Uterus sounded to 7cm. Endocervical canal normal-appearing. Endometrium atrophic without any polyps or masses. Bilateral tubal ostia visualized. Complications: None Indications:  57 y.o. with multiple large cervical polyps who desired removal and evaluation of endometrium for possible endometrial polyps.  Description of each procedure:  After informed consent was obtained the patient was taken to the operating room in the dorsal supine position.  After administration of general anesthesia, the patient was placed in the dorsal lithotomy position and prepped and draped in the usual sterile fashion. A pre-operative time-out was completed.  A ring forcep was used to twist the cervical polyps at the stalk and remove them. The anterior lip of the cervix was grasped with a single-tooth tenaculum and the cervix was serially dilated to accommodate the hysteroscope.  The hysteroscope was advanced and the findings as above was noted. The single-tooth tenaculum was removed and its sites were made hemostatic with silver nitrate and pressure. Silver nitrate used in the endocervical canal.  Adequate hemostasis was noted.  The patient was awakened and extubated and appeared to have tolerated the procedure well.  All counts were correct.  Disposition:  PACU  [name], DO

## 2022-09-07 NOTE — Anesthesia Procedure Notes (Signed)
Procedure Name: LMA Insertion Date/Time: 09/07/2022 8:29 AM  Performed by: Rogers Blocker, CRNAPre-anesthesia Checklist: Patient identified, Emergency Drugs available, Suction available and Patient being monitored Patient Re-evaluated:Patient Re-evaluated prior to induction Oxygen Delivery Method: Circle System Utilized Preoxygenation: Pre-oxygenation with 100% oxygen Induction Type: IV induction Ventilation: Mask ventilation without difficulty LMA: LMA inserted LMA Size: 4.0 Number of attempts: 1 Airway Equipment and Method: Bite block Placement Confirmation: positive ETCO2 Tube secured with: Tape Dental Injury: Teeth and Oropharynx as per pre-operative assessment

## 2022-09-07 NOTE — Discharge Instructions (Addendum)
No ibuprofen, Advil, Aleve, Motrin, ketorolac, meloxicam, naproxen, or other NSAIDS until after 2:45 pm today if needed.    D & C Home care Instructions:   Personal hygiene:  Used sanitary napkins for vaginal drainage not tampons. Shower or tub bathe the day after your procedure. No douching until bleeding stops. Always wipe from front to back after  Elimination.  Activity: Do not drive or operate any equipment today. The effects of the anesthesia are still present and drowsiness may result. Rest today, not necessarily flat bed rest, just take it easy. You may resume your normal activity in one to 2 days.  Sexual activity: No intercourse for one week or as indicated by your physician  Diet: Eat a light diet as desired this evening. You may resume a regular diet tomorrow.  Return to work: One to 2 days.  General Expectations of your surgery: Vaginal bleeding should be no heavier than a normal period. Spotting may continue up to 10 days. Mild cramps may continue for a couple of days. You may have a regular period in 2-6 weeks.  Unexpected observations call your doctor if these occur: persistent or heavy bleeding. Severe abdominal cramping or pain. Elevation of temperature greater than 100F.   Post Anesthesia Home Care Instructions  Activity: Get plenty of rest for the remainder of the day. A responsible individual must stay with you for 24 hours following the procedure.  For the next 24 hours, DO NOT: -Drive a car -Paediatric nurse -Drink alcoholic beverages -Take any medication unless instructed by your physician -Make any legal decisions or sign important papers.  Meals: Start with liquid foods such as gelatin or soup. Progress to regular foods as tolerated. Avoid greasy, spicy, heavy foods. If nausea and/or vomiting occur, drink only clear liquids until the nausea and/or vomiting subsides. Call your physician if vomiting continues.  Special Instructions/Symptoms: Your throat  may feel dry or sore from the anesthesia or the breathing tube placed in your throat during surgery. If this causes discomfort, gargle with warm salt water. The discomfort should disappear within 24 hours.

## 2022-09-07 NOTE — Anesthesia Preprocedure Evaluation (Signed)
Anesthesia Evaluation  Patient identified by MRN, date of birth, ID band Patient awake    Reviewed: Allergy & Precautions, NPO status , Patient's Chart, lab work & pertinent test results  Airway Mallampati: I       Dental no notable dental hx.    Pulmonary neg pulmonary ROS   Pulmonary exam normal        Cardiovascular negative cardio ROS Normal cardiovascular exam     Neuro/Psych negative neurological ROS     GI/Hepatic negative GI ROS, Neg liver ROS,,,  Endo/Other    Renal/GU negative Renal ROS  negative genitourinary   Musculoskeletal negative musculoskeletal ROS (+)    Abdominal  (+) + obese  Peds  Hematology   Anesthesia Other Findings   Reproductive/Obstetrics                             Anesthesia Physical Anesthesia Plan  ASA: 2  Anesthesia Plan: General   Post-op Pain Management: Minimal or no pain anticipated   Induction: Intravenous  PONV Risk Score and Plan: 3 and Ondansetron, Dexamethasone and Midazolam  Airway Management Planned: LMA  Additional Equipment: None  Intra-op Plan:   Post-operative Plan: Extubation in OR  Informed Consent: I have reviewed the patients History and Physical, chart, labs and discussed the procedure including the risks, benefits and alternatives for the proposed anesthesia with the patient or authorized representative who has indicated his/her understanding and acceptance.       Plan Discussed with: CRNA  Anesthesia Plan Comments:        Anesthesia Quick Evaluation

## 2022-09-07 NOTE — Interval H&P Note (Signed)
History and Physical Interval Note:  09/07/2022 7:59 AM  Kylie Lynch  has presented today for surgery, with the diagnosis of Cervical Polyp.  The various methods of treatment have been discussed with the patient and family. After consideration of risks, benefits and other options for treatment, the patient has consented to  Procedure(s): [place] (N/A) as a surgical intervention.  The patient's history has been reviewed, patient examined, no change in status, stable for surgery.  I have reviewed the patient's chart and labs.  Questions were answered to the patient's satisfaction.     [name]

## 2022-09-07 NOTE — Anesthesia Postprocedure Evaluation (Signed)
Anesthesia Post Note  Patient: Kylie Lynch  Procedure(s) Performed: DILATATION & CURETTAGE/HYSTEROSCOPY WITH POLYPECTOMY (Vagina )     Patient location during evaluation: Phase II Anesthesia Type: General Level of consciousness: awake Pain management: pain level controlled Vital Signs Assessment: post-procedure vital signs reviewed and stable Respiratory status: spontaneous breathing Cardiovascular status: stable Postop Assessment: no apparent nausea or vomiting Anesthetic complications: no  No notable events documented.  Last Vitals:  Vitals:   09/07/22 0900 09/07/22 0915  BP: 126/77 124/68  Pulse: 87 67  Resp: 14 12  Temp:  36.6 C  SpO2: 98% 95%    Last Pain:  Vitals:   09/07/22 0935  TempSrc:   PainSc: 0-No pain                 Huston Foley

## 2022-09-07 NOTE — Transfer of Care (Signed)
Immediate Anesthesia Transfer of Care Note  Patient: Kylie Lynch  Procedure(s) Performed: DILATATION & CURETTAGE/HYSTEROSCOPY WITH POLYPECTOMY (Vagina )  Patient Location: PACU  Anesthesia Type:General  Level of Consciousness: awake, alert , oriented, and patient cooperative  Airway & Oxygen Therapy: Patient Spontanous Breathing  Post-op Assessment: Report given to RN and Post -op Vital signs reviewed and stable  Post vital signs: Reviewed and stable  Last Vitals:  Vitals Value Taken Time  BP 136/67 09/07/22 0855  Temp 36.6 C 09/07/22 0855  Pulse 83 09/07/22 0859  Resp 22 09/07/22 0859  SpO2 99 % 09/07/22 0859  Vitals shown include unvalidated device data.  Last Pain:  Vitals:   09/07/22 0714  TempSrc: Oral  PainSc: 0-No pain      Patients Stated Pain Goal: 6 ([id number])  Complications: No notable events documented.

## 2022-09-08 ENCOUNTER — Encounter (HOSPITAL_BASED_OUTPATIENT_CLINIC_OR_DEPARTMENT_OTHER): Payer: Self-pay | Admitting: Obstetrics and Gynecology

## 2022-09-08 LAB — SURGICAL PATHOLOGY

## 2022-09-13 ENCOUNTER — Other Ambulatory Visit (HOSPITAL_BASED_OUTPATIENT_CLINIC_OR_DEPARTMENT_OTHER): Payer: Self-pay

## 2022-09-13 MED ORDER — LISDEXAMFETAMINE DIMESYLATE 40 MG PO CAPS
40.0000 mg | ORAL_CAPSULE | Freq: Every day | ORAL | 0 refills | Status: DC
  Filled 2022-09-13: qty 30, 30d supply, fill #0

## 2022-09-19 DIAGNOSIS — N841 Polyp of cervix uteri: Secondary | ICD-10-CM | POA: Diagnosis not present

## 2022-09-21 DIAGNOSIS — Z79899 Other long term (current) drug therapy: Secondary | ICD-10-CM | POA: Diagnosis not present

## 2022-09-21 DIAGNOSIS — F902 Attention-deficit hyperactivity disorder, combined type: Secondary | ICD-10-CM | POA: Diagnosis not present

## 2022-10-11 ENCOUNTER — Other Ambulatory Visit (HOSPITAL_BASED_OUTPATIENT_CLINIC_OR_DEPARTMENT_OTHER): Payer: Self-pay

## 2022-10-11 MED ORDER — LISDEXAMFETAMINE DIMESYLATE 40 MG PO CAPS
40.0000 mg | ORAL_CAPSULE | Freq: Every day | ORAL | 0 refills | Status: DC
  Filled 2022-10-11: qty 30, 30d supply, fill #0

## 2022-10-13 ENCOUNTER — Other Ambulatory Visit (HOSPITAL_BASED_OUTPATIENT_CLINIC_OR_DEPARTMENT_OTHER): Payer: Self-pay

## 2022-10-13 DIAGNOSIS — Z131 Encounter for screening for diabetes mellitus: Secondary | ICD-10-CM | POA: Diagnosis not present

## 2022-10-13 DIAGNOSIS — L309 Dermatitis, unspecified: Secondary | ICD-10-CM | POA: Diagnosis not present

## 2022-10-13 DIAGNOSIS — E039 Hypothyroidism, unspecified: Secondary | ICD-10-CM | POA: Diagnosis not present

## 2022-10-13 DIAGNOSIS — N951 Menopausal and female climacteric states: Secondary | ICD-10-CM | POA: Diagnosis not present

## 2022-10-13 DIAGNOSIS — N926 Irregular menstruation, unspecified: Secondary | ICD-10-CM | POA: Diagnosis not present

## 2022-10-13 DIAGNOSIS — E538 Deficiency of other specified B group vitamins: Secondary | ICD-10-CM | POA: Diagnosis not present

## 2022-10-13 DIAGNOSIS — E559 Vitamin D deficiency, unspecified: Secondary | ICD-10-CM | POA: Diagnosis not present

## 2022-10-13 DIAGNOSIS — Z5181 Encounter for therapeutic drug level monitoring: Secondary | ICD-10-CM | POA: Diagnosis not present

## 2022-11-02 DIAGNOSIS — Z5181 Encounter for therapeutic drug level monitoring: Secondary | ICD-10-CM | POA: Diagnosis not present

## 2022-11-02 DIAGNOSIS — R7989 Other specified abnormal findings of blood chemistry: Secondary | ICD-10-CM | POA: Diagnosis not present

## 2022-11-02 DIAGNOSIS — E039 Hypothyroidism, unspecified: Secondary | ICD-10-CM | POA: Diagnosis not present

## 2022-11-02 DIAGNOSIS — E559 Vitamin D deficiency, unspecified: Secondary | ICD-10-CM | POA: Diagnosis not present

## 2022-11-02 DIAGNOSIS — N951 Menopausal and female climacteric states: Secondary | ICD-10-CM | POA: Diagnosis not present

## 2022-11-16 ENCOUNTER — Other Ambulatory Visit (HOSPITAL_BASED_OUTPATIENT_CLINIC_OR_DEPARTMENT_OTHER): Payer: Self-pay

## 2022-11-16 MED ORDER — LISDEXAMFETAMINE DIMESYLATE 40 MG PO CAPS
40.0000 mg | ORAL_CAPSULE | Freq: Every day | ORAL | 0 refills | Status: DC
  Filled 2022-11-16: qty 30, 30d supply, fill #0

## 2022-12-16 ENCOUNTER — Other Ambulatory Visit (HOSPITAL_BASED_OUTPATIENT_CLINIC_OR_DEPARTMENT_OTHER): Payer: Self-pay

## 2022-12-16 MED ORDER — LISDEXAMFETAMINE DIMESYLATE 40 MG PO CAPS
40.0000 mg | ORAL_CAPSULE | Freq: Every day | ORAL | 0 refills | Status: AC
  Filled 2022-12-16: qty 30, 30d supply, fill #0

## 2023-01-18 ENCOUNTER — Other Ambulatory Visit (HOSPITAL_BASED_OUTPATIENT_CLINIC_OR_DEPARTMENT_OTHER): Payer: Self-pay

## 2023-01-18 MED ORDER — LISDEXAMFETAMINE DIMESYLATE 50 MG PO CAPS
50.0000 mg | ORAL_CAPSULE | Freq: Every day | ORAL | 0 refills | Status: DC
  Filled 2023-01-18: qty 30, 30d supply, fill #0

## 2023-02-08 ENCOUNTER — Other Ambulatory Visit: Payer: Self-pay | Admitting: Family Medicine

## 2023-02-08 DIAGNOSIS — Z1231 Encounter for screening mammogram for malignant neoplasm of breast: Secondary | ICD-10-CM

## 2023-02-10 ENCOUNTER — Other Ambulatory Visit (HOSPITAL_BASED_OUTPATIENT_CLINIC_OR_DEPARTMENT_OTHER): Payer: Self-pay

## 2023-02-10 DIAGNOSIS — Z79899 Other long term (current) drug therapy: Secondary | ICD-10-CM | POA: Diagnosis not present

## 2023-02-10 DIAGNOSIS — F902 Attention-deficit hyperactivity disorder, combined type: Secondary | ICD-10-CM | POA: Diagnosis not present

## 2023-02-10 MED ORDER — LISDEXAMFETAMINE DIMESYLATE 50 MG PO CAPS
50.0000 mg | ORAL_CAPSULE | Freq: Every day | ORAL | 0 refills | Status: DC
  Filled 2023-02-10 – 2023-02-17 (×2): qty 30, 30d supply, fill #0

## 2023-02-13 DIAGNOSIS — F902 Attention-deficit hyperactivity disorder, combined type: Secondary | ICD-10-CM | POA: Diagnosis not present

## 2023-02-17 ENCOUNTER — Other Ambulatory Visit (HOSPITAL_BASED_OUTPATIENT_CLINIC_OR_DEPARTMENT_OTHER): Payer: Self-pay

## 2023-03-24 ENCOUNTER — Other Ambulatory Visit (HOSPITAL_BASED_OUTPATIENT_CLINIC_OR_DEPARTMENT_OTHER): Payer: Self-pay

## 2023-03-24 MED ORDER — LISDEXAMFETAMINE DIMESYLATE 50 MG PO CAPS
50.0000 mg | ORAL_CAPSULE | Freq: Every day | ORAL | 0 refills | Status: DC
  Filled 2023-03-24: qty 30, 30d supply, fill #0

## 2023-03-28 DIAGNOSIS — E039 Hypothyroidism, unspecified: Secondary | ICD-10-CM | POA: Diagnosis not present

## 2023-03-28 DIAGNOSIS — L309 Dermatitis, unspecified: Secondary | ICD-10-CM | POA: Diagnosis not present

## 2023-03-28 DIAGNOSIS — E538 Deficiency of other specified B group vitamins: Secondary | ICD-10-CM | POA: Diagnosis not present

## 2023-03-28 DIAGNOSIS — E559 Vitamin D deficiency, unspecified: Secondary | ICD-10-CM | POA: Diagnosis not present

## 2023-03-28 DIAGNOSIS — R635 Abnormal weight gain: Secondary | ICD-10-CM | POA: Diagnosis not present

## 2023-04-11 DIAGNOSIS — E039 Hypothyroidism, unspecified: Secondary | ICD-10-CM | POA: Diagnosis not present

## 2023-04-11 DIAGNOSIS — Z5181 Encounter for therapeutic drug level monitoring: Secondary | ICD-10-CM | POA: Diagnosis not present

## 2023-04-11 DIAGNOSIS — R7989 Other specified abnormal findings of blood chemistry: Secondary | ICD-10-CM | POA: Diagnosis not present

## 2023-04-11 DIAGNOSIS — N951 Menopausal and female climacteric states: Secondary | ICD-10-CM | POA: Diagnosis not present

## 2023-04-12 ENCOUNTER — Ambulatory Visit: Payer: BC Managed Care – PPO

## 2023-04-12 DIAGNOSIS — Z1231 Encounter for screening mammogram for malignant neoplasm of breast: Secondary | ICD-10-CM

## 2023-04-21 ENCOUNTER — Other Ambulatory Visit: Payer: Self-pay

## 2023-04-21 ENCOUNTER — Other Ambulatory Visit (HOSPITAL_BASED_OUTPATIENT_CLINIC_OR_DEPARTMENT_OTHER): Payer: Self-pay

## 2023-04-25 ENCOUNTER — Other Ambulatory Visit (HOSPITAL_BASED_OUTPATIENT_CLINIC_OR_DEPARTMENT_OTHER): Payer: Self-pay

## 2023-04-25 MED ORDER — LISDEXAMFETAMINE DIMESYLATE 50 MG PO CAPS
50.0000 mg | ORAL_CAPSULE | Freq: Every day | ORAL | 0 refills | Status: DC
  Filled 2023-04-25: qty 30, 30d supply, fill #0

## 2023-05-10 DIAGNOSIS — Z124 Encounter for screening for malignant neoplasm of cervix: Secondary | ICD-10-CM | POA: Diagnosis not present

## 2023-05-10 DIAGNOSIS — Z01419 Encounter for gynecological examination (general) (routine) without abnormal findings: Secondary | ICD-10-CM | POA: Diagnosis not present

## 2023-05-10 DIAGNOSIS — N941 Unspecified dyspareunia: Secondary | ICD-10-CM | POA: Diagnosis not present

## 2023-05-10 DIAGNOSIS — N841 Polyp of cervix uteri: Secondary | ICD-10-CM | POA: Diagnosis not present

## 2023-05-10 DIAGNOSIS — N393 Stress incontinence (female) (male): Secondary | ICD-10-CM | POA: Diagnosis not present

## 2023-05-22 ENCOUNTER — Other Ambulatory Visit (HOSPITAL_BASED_OUTPATIENT_CLINIC_OR_DEPARTMENT_OTHER): Payer: Self-pay

## 2023-05-22 MED ORDER — LISDEXAMFETAMINE DIMESYLATE 50 MG PO CAPS
50.0000 mg | ORAL_CAPSULE | Freq: Every day | ORAL | 0 refills | Status: DC
  Filled 2023-05-30: qty 30, 30d supply, fill #0

## 2023-05-30 ENCOUNTER — Other Ambulatory Visit (HOSPITAL_BASED_OUTPATIENT_CLINIC_OR_DEPARTMENT_OTHER): Payer: Self-pay

## 2023-05-30 DIAGNOSIS — Z13220 Encounter for screening for lipoid disorders: Secondary | ICD-10-CM | POA: Diagnosis not present

## 2023-05-30 DIAGNOSIS — E6609 Other obesity due to excess calories: Secondary | ICD-10-CM | POA: Diagnosis not present

## 2023-05-30 DIAGNOSIS — Z0000 Encounter for general adult medical examination without abnormal findings: Secondary | ICD-10-CM | POA: Diagnosis not present

## 2023-06-04 ENCOUNTER — Other Ambulatory Visit: Payer: Self-pay | Admitting: Medical Genetics

## 2023-06-04 DIAGNOSIS — Z006 Encounter for examination for normal comparison and control in clinical research program: Secondary | ICD-10-CM

## 2023-06-13 DIAGNOSIS — Z79899 Other long term (current) drug therapy: Secondary | ICD-10-CM | POA: Diagnosis not present

## 2023-06-13 DIAGNOSIS — F902 Attention-deficit hyperactivity disorder, combined type: Secondary | ICD-10-CM | POA: Diagnosis not present

## 2023-06-13 DIAGNOSIS — F419 Anxiety disorder, unspecified: Secondary | ICD-10-CM | POA: Diagnosis not present

## 2023-06-30 ENCOUNTER — Other Ambulatory Visit (HOSPITAL_BASED_OUTPATIENT_CLINIC_OR_DEPARTMENT_OTHER): Payer: Self-pay

## 2023-06-30 DIAGNOSIS — I788 Other diseases of capillaries: Secondary | ICD-10-CM | POA: Diagnosis not present

## 2023-06-30 DIAGNOSIS — D2261 Melanocytic nevi of right upper limb, including shoulder: Secondary | ICD-10-CM | POA: Diagnosis not present

## 2023-06-30 DIAGNOSIS — L910 Hypertrophic scar: Secondary | ICD-10-CM | POA: Diagnosis not present

## 2023-06-30 DIAGNOSIS — D2271 Melanocytic nevi of right lower limb, including hip: Secondary | ICD-10-CM | POA: Diagnosis not present

## 2023-06-30 DIAGNOSIS — D2372 Other benign neoplasm of skin of left lower limb, including hip: Secondary | ICD-10-CM | POA: Diagnosis not present

## 2023-06-30 MED ORDER — LISDEXAMFETAMINE DIMESYLATE 50 MG PO CAPS
50.0000 mg | ORAL_CAPSULE | Freq: Every day | ORAL | 0 refills | Status: DC
  Filled 2023-06-30 – 2023-07-13 (×2): qty 30, 30d supply, fill #0

## 2023-07-09 DIAGNOSIS — J189 Pneumonia, unspecified organism: Secondary | ICD-10-CM | POA: Diagnosis not present

## 2023-07-10 ENCOUNTER — Other Ambulatory Visit (HOSPITAL_BASED_OUTPATIENT_CLINIC_OR_DEPARTMENT_OTHER): Payer: Self-pay

## 2023-07-11 ENCOUNTER — Other Ambulatory Visit (HOSPITAL_BASED_OUTPATIENT_CLINIC_OR_DEPARTMENT_OTHER): Payer: Self-pay

## 2023-07-13 ENCOUNTER — Other Ambulatory Visit (HOSPITAL_BASED_OUTPATIENT_CLINIC_OR_DEPARTMENT_OTHER): Payer: Self-pay

## 2023-07-14 DIAGNOSIS — U071 COVID-19: Secondary | ICD-10-CM | POA: Diagnosis not present

## 2023-07-14 DIAGNOSIS — J189 Pneumonia, unspecified organism: Secondary | ICD-10-CM | POA: Diagnosis not present

## 2023-08-02 ENCOUNTER — Other Ambulatory Visit (HOSPITAL_COMMUNITY): Payer: Self-pay | Attending: Medical Genetics

## 2023-08-02 DIAGNOSIS — M533 Sacrococcygeal disorders, not elsewhere classified: Secondary | ICD-10-CM | POA: Diagnosis not present

## 2023-08-02 DIAGNOSIS — N3946 Mixed incontinence: Secondary | ICD-10-CM | POA: Diagnosis not present

## 2023-08-02 DIAGNOSIS — R278 Other lack of coordination: Secondary | ICD-10-CM | POA: Diagnosis not present

## 2023-08-02 DIAGNOSIS — M9903 Segmental and somatic dysfunction of lumbar region: Secondary | ICD-10-CM | POA: Diagnosis not present

## 2023-08-08 DIAGNOSIS — R7989 Other specified abnormal findings of blood chemistry: Secondary | ICD-10-CM | POA: Diagnosis not present

## 2023-08-08 DIAGNOSIS — E039 Hypothyroidism, unspecified: Secondary | ICD-10-CM | POA: Diagnosis not present

## 2023-08-08 DIAGNOSIS — Z5181 Encounter for therapeutic drug level monitoring: Secondary | ICD-10-CM | POA: Diagnosis not present

## 2023-08-08 DIAGNOSIS — Z1321 Encounter for screening for nutritional disorder: Secondary | ICD-10-CM | POA: Diagnosis not present

## 2023-08-08 DIAGNOSIS — N951 Menopausal and female climacteric states: Secondary | ICD-10-CM | POA: Diagnosis not present

## 2023-08-08 DIAGNOSIS — E559 Vitamin D deficiency, unspecified: Secondary | ICD-10-CM | POA: Diagnosis not present

## 2023-08-11 DIAGNOSIS — L910 Hypertrophic scar: Secondary | ICD-10-CM | POA: Diagnosis not present

## 2023-08-17 DIAGNOSIS — N3946 Mixed incontinence: Secondary | ICD-10-CM | POA: Diagnosis not present

## 2023-08-17 DIAGNOSIS — R278 Other lack of coordination: Secondary | ICD-10-CM | POA: Diagnosis not present

## 2023-08-17 DIAGNOSIS — M533 Sacrococcygeal disorders, not elsewhere classified: Secondary | ICD-10-CM | POA: Diagnosis not present

## 2023-08-17 DIAGNOSIS — M9903 Segmental and somatic dysfunction of lumbar region: Secondary | ICD-10-CM | POA: Diagnosis not present

## 2023-08-22 ENCOUNTER — Other Ambulatory Visit (HOSPITAL_BASED_OUTPATIENT_CLINIC_OR_DEPARTMENT_OTHER): Payer: Self-pay

## 2023-08-22 DIAGNOSIS — R7989 Other specified abnormal findings of blood chemistry: Secondary | ICD-10-CM | POA: Diagnosis not present

## 2023-08-22 DIAGNOSIS — E039 Hypothyroidism, unspecified: Secondary | ICD-10-CM | POA: Diagnosis not present

## 2023-08-22 DIAGNOSIS — Z5181 Encounter for therapeutic drug level monitoring: Secondary | ICD-10-CM | POA: Diagnosis not present

## 2023-08-22 DIAGNOSIS — N951 Menopausal and female climacteric states: Secondary | ICD-10-CM | POA: Diagnosis not present

## 2023-08-22 MED ORDER — LISDEXAMFETAMINE DIMESYLATE 50 MG PO CAPS
50.0000 mg | ORAL_CAPSULE | Freq: Every day | ORAL | 0 refills | Status: DC
  Filled 2023-08-22: qty 30, 30d supply, fill #0

## 2023-09-20 ENCOUNTER — Other Ambulatory Visit (HOSPITAL_BASED_OUTPATIENT_CLINIC_OR_DEPARTMENT_OTHER): Payer: Self-pay

## 2023-09-20 MED ORDER — LISDEXAMFETAMINE DIMESYLATE 50 MG PO CAPS
50.0000 mg | ORAL_CAPSULE | Freq: Every day | ORAL | 0 refills | Status: DC
  Filled 2023-09-20: qty 30, 30d supply, fill #0

## 2023-10-03 DIAGNOSIS — L910 Hypertrophic scar: Secondary | ICD-10-CM | POA: Diagnosis not present

## 2023-10-17 DIAGNOSIS — H109 Unspecified conjunctivitis: Secondary | ICD-10-CM | POA: Diagnosis not present

## 2023-10-20 ENCOUNTER — Other Ambulatory Visit (HOSPITAL_BASED_OUTPATIENT_CLINIC_OR_DEPARTMENT_OTHER): Payer: Self-pay

## 2023-10-20 DIAGNOSIS — Z79899 Other long term (current) drug therapy: Secondary | ICD-10-CM | POA: Diagnosis not present

## 2023-10-20 DIAGNOSIS — F902 Attention-deficit hyperactivity disorder, combined type: Secondary | ICD-10-CM | POA: Diagnosis not present

## 2023-10-20 MED ORDER — LISDEXAMFETAMINE DIMESYLATE 50 MG PO CAPS
50.0000 mg | ORAL_CAPSULE | Freq: Every day | ORAL | 0 refills | Status: DC
  Filled 2023-10-23: qty 30, 30d supply, fill #0

## 2023-10-23 ENCOUNTER — Other Ambulatory Visit (HOSPITAL_BASED_OUTPATIENT_CLINIC_OR_DEPARTMENT_OTHER): Payer: Self-pay

## 2023-11-20 DIAGNOSIS — L910 Hypertrophic scar: Secondary | ICD-10-CM | POA: Diagnosis not present

## 2023-11-20 DIAGNOSIS — D485 Neoplasm of uncertain behavior of skin: Secondary | ICD-10-CM | POA: Diagnosis not present

## 2023-11-27 ENCOUNTER — Other Ambulatory Visit (HOSPITAL_BASED_OUTPATIENT_CLINIC_OR_DEPARTMENT_OTHER): Payer: Self-pay

## 2023-11-27 MED ORDER — LISDEXAMFETAMINE DIMESYLATE 50 MG PO CAPS
50.0000 mg | ORAL_CAPSULE | Freq: Every day | ORAL | 0 refills | Status: DC
  Filled 2023-11-27: qty 30, 30d supply, fill #0

## 2024-01-02 ENCOUNTER — Other Ambulatory Visit (HOSPITAL_BASED_OUTPATIENT_CLINIC_OR_DEPARTMENT_OTHER): Payer: Self-pay

## 2024-01-02 MED ORDER — LISDEXAMFETAMINE DIMESYLATE 50 MG PO CAPS
50.0000 mg | ORAL_CAPSULE | Freq: Every day | ORAL | 0 refills | Status: DC
  Filled 2024-01-02: qty 30, 30d supply, fill #0

## 2024-01-04 DIAGNOSIS — L905 Scar conditions and fibrosis of skin: Secondary | ICD-10-CM | POA: Diagnosis not present

## 2024-01-04 DIAGNOSIS — L570 Actinic keratosis: Secondary | ICD-10-CM | POA: Diagnosis not present

## 2024-02-05 ENCOUNTER — Other Ambulatory Visit (HOSPITAL_BASED_OUTPATIENT_CLINIC_OR_DEPARTMENT_OTHER): Payer: Self-pay

## 2024-02-05 MED ORDER — LISDEXAMFETAMINE DIMESYLATE 50 MG PO CAPS
50.0000 mg | ORAL_CAPSULE | Freq: Every day | ORAL | 0 refills | Status: DC
  Filled 2024-02-05: qty 30, 30d supply, fill #0

## 2024-02-06 DIAGNOSIS — E039 Hypothyroidism, unspecified: Secondary | ICD-10-CM | POA: Diagnosis not present

## 2024-02-06 DIAGNOSIS — N951 Menopausal and female climacteric states: Secondary | ICD-10-CM | POA: Diagnosis not present

## 2024-02-06 DIAGNOSIS — R7989 Other specified abnormal findings of blood chemistry: Secondary | ICD-10-CM | POA: Diagnosis not present

## 2024-02-06 DIAGNOSIS — Z5181 Encounter for therapeutic drug level monitoring: Secondary | ICD-10-CM | POA: Diagnosis not present

## 2024-02-06 DIAGNOSIS — Z1321 Encounter for screening for nutritional disorder: Secondary | ICD-10-CM | POA: Diagnosis not present

## 2024-02-06 DIAGNOSIS — Z131 Encounter for screening for diabetes mellitus: Secondary | ICD-10-CM | POA: Diagnosis not present

## 2024-02-06 DIAGNOSIS — E559 Vitamin D deficiency, unspecified: Secondary | ICD-10-CM | POA: Diagnosis not present

## 2024-02-21 DIAGNOSIS — E039 Hypothyroidism, unspecified: Secondary | ICD-10-CM | POA: Diagnosis not present

## 2024-02-21 DIAGNOSIS — R7989 Other specified abnormal findings of blood chemistry: Secondary | ICD-10-CM | POA: Diagnosis not present

## 2024-02-21 DIAGNOSIS — N951 Menopausal and female climacteric states: Secondary | ICD-10-CM | POA: Diagnosis not present

## 2024-02-21 DIAGNOSIS — Z5181 Encounter for therapeutic drug level monitoring: Secondary | ICD-10-CM | POA: Diagnosis not present

## 2024-03-01 ENCOUNTER — Other Ambulatory Visit: Payer: Self-pay | Admitting: Family Medicine

## 2024-03-01 DIAGNOSIS — Z1239 Encounter for other screening for malignant neoplasm of breast: Secondary | ICD-10-CM

## 2024-03-21 ENCOUNTER — Other Ambulatory Visit (HOSPITAL_BASED_OUTPATIENT_CLINIC_OR_DEPARTMENT_OTHER): Payer: Self-pay

## 2024-03-21 MED ORDER — LISDEXAMFETAMINE DIMESYLATE 50 MG PO CAPS
50.0000 mg | ORAL_CAPSULE | Freq: Every day | ORAL | 0 refills | Status: DC
  Filled 2024-03-21: qty 30, 30d supply, fill #0

## 2024-04-19 ENCOUNTER — Other Ambulatory Visit (HOSPITAL_BASED_OUTPATIENT_CLINIC_OR_DEPARTMENT_OTHER): Payer: Self-pay

## 2024-04-19 MED ORDER — LISDEXAMFETAMINE DIMESYLATE 50 MG PO CAPS
50.0000 mg | ORAL_CAPSULE | Freq: Every day | ORAL | 0 refills | Status: DC
  Filled 2024-04-19: qty 30, 30d supply, fill #0

## 2024-05-02 ENCOUNTER — Other Ambulatory Visit: Payer: Self-pay | Admitting: Medical Genetics

## 2024-05-02 DIAGNOSIS — Z006 Encounter for examination for normal comparison and control in clinical research program: Secondary | ICD-10-CM

## 2024-05-06 ENCOUNTER — Other Ambulatory Visit (HOSPITAL_BASED_OUTPATIENT_CLINIC_OR_DEPARTMENT_OTHER): Payer: Self-pay

## 2024-05-06 DIAGNOSIS — F902 Attention-deficit hyperactivity disorder, combined type: Secondary | ICD-10-CM | POA: Diagnosis not present

## 2024-05-06 DIAGNOSIS — Z79899 Other long term (current) drug therapy: Secondary | ICD-10-CM | POA: Diagnosis not present

## 2024-05-06 DIAGNOSIS — F419 Anxiety disorder, unspecified: Secondary | ICD-10-CM | POA: Diagnosis not present

## 2024-05-06 MED ORDER — LISDEXAMFETAMINE DIMESYLATE 50 MG PO CAPS
50.0000 mg | ORAL_CAPSULE | Freq: Every day | ORAL | 0 refills | Status: AC
  Filled 2024-06-04: qty 30, 30d supply, fill #0

## 2024-05-08 DIAGNOSIS — F902 Attention-deficit hyperactivity disorder, combined type: Secondary | ICD-10-CM | POA: Diagnosis not present

## 2024-05-08 DIAGNOSIS — Z79899 Other long term (current) drug therapy: Secondary | ICD-10-CM | POA: Diagnosis not present

## 2024-06-04 ENCOUNTER — Other Ambulatory Visit (HOSPITAL_BASED_OUTPATIENT_CLINIC_OR_DEPARTMENT_OTHER): Payer: Self-pay

## 2024-06-04 DIAGNOSIS — Z13220 Encounter for screening for lipoid disorders: Secondary | ICD-10-CM | POA: Diagnosis not present

## 2024-06-04 DIAGNOSIS — Z1231 Encounter for screening mammogram for malignant neoplasm of breast: Secondary | ICD-10-CM | POA: Diagnosis not present

## 2024-06-04 DIAGNOSIS — Z0000 Encounter for general adult medical examination without abnormal findings: Secondary | ICD-10-CM | POA: Diagnosis not present

## 2024-06-04 DIAGNOSIS — Z131 Encounter for screening for diabetes mellitus: Secondary | ICD-10-CM | POA: Diagnosis not present

## 2024-06-04 MED ORDER — LISDEXAMFETAMINE DIMESYLATE 50 MG PO CAPS: 50.0000 mg | ORAL_CAPSULE | Freq: Every day | ORAL | 0 refills | Status: AC

## 2024-06-26 ENCOUNTER — Ambulatory Visit (INDEPENDENT_AMBULATORY_CARE_PROVIDER_SITE_OTHER)

## 2024-06-26 DIAGNOSIS — Z1239 Encounter for other screening for malignant neoplasm of breast: Secondary | ICD-10-CM

## 2024-06-26 DIAGNOSIS — Z1231 Encounter for screening mammogram for malignant neoplasm of breast: Secondary | ICD-10-CM | POA: Diagnosis not present

## 2024-07-01 ENCOUNTER — Other Ambulatory Visit: Payer: Self-pay | Admitting: Family Medicine

## 2024-07-01 DIAGNOSIS — R928 Other abnormal and inconclusive findings on diagnostic imaging of breast: Secondary | ICD-10-CM

## 2024-07-01 DIAGNOSIS — H04123 Dry eye syndrome of bilateral lacrimal glands: Secondary | ICD-10-CM | POA: Diagnosis not present

## 2024-07-01 DIAGNOSIS — H40013 Open angle with borderline findings, low risk, bilateral: Secondary | ICD-10-CM | POA: Diagnosis not present

## 2024-07-08 ENCOUNTER — Other Ambulatory Visit (HOSPITAL_BASED_OUTPATIENT_CLINIC_OR_DEPARTMENT_OTHER): Payer: Self-pay

## 2024-07-08 MED ORDER — LISDEXAMFETAMINE DIMESYLATE 50 MG PO CAPS
50.0000 mg | ORAL_CAPSULE | Freq: Every day | ORAL | 0 refills | Status: AC
  Filled 2024-07-08: qty 30, 30d supply, fill #0

## 2024-07-17 ENCOUNTER — Ambulatory Visit
Admission: RE | Admit: 2024-07-17 | Discharge: 2024-07-17 | Disposition: A | Discharge status: Home / Self Care | Source: Ambulatory Visit | Attending: Family Medicine | Admitting: Family Medicine

## 2024-07-17 ENCOUNTER — Other Ambulatory Visit: Payer: Self-pay | Admitting: Family Medicine

## 2024-07-17 DIAGNOSIS — R928 Other abnormal and inconclusive findings on diagnostic imaging of breast: Secondary | ICD-10-CM

## 2024-07-17 DIAGNOSIS — N6324 Unspecified lump in the left breast, lower inner quadrant: Secondary | ICD-10-CM

## 2024-07-23 ENCOUNTER — Ambulatory Visit
Admission: RE | Admit: 2024-07-23 | Discharge: 2024-07-23 | Disposition: A | Source: Ambulatory Visit | Attending: Family Medicine | Admitting: Family Medicine

## 2024-07-23 ENCOUNTER — Encounter

## 2024-07-23 ENCOUNTER — Other Ambulatory Visit

## 2024-07-23 DIAGNOSIS — N6324 Unspecified lump in the left breast, lower inner quadrant: Secondary | ICD-10-CM

## 2024-07-23 HISTORY — PX: BREAST BIOPSY: SHX20

## 2024-07-24 LAB — SURGICAL PATHOLOGY
